# Patient Record
Sex: Female | Born: 1960
Health system: Southern US, Community
[De-identification: ages and names within clinical notes are randomized; demographics above are authoritative.]

## PROBLEM LIST (undated history)

## (undated) DIAGNOSIS — E079 Disorder of thyroid, unspecified: Secondary | ICD-10-CM

## (undated) HISTORY — DX: Disorder of thyroid, unspecified: E07.9

## (undated) HISTORY — PX: OTHER SURGICAL HISTORY: SHX169

## (undated) HISTORY — PX: BREAST CYST EXCISION: SHX579

---

## 2003-09-26 ENCOUNTER — Encounter: Admission: RE | Admit: 2003-09-26 | Discharge: 2003-09-26 | Payer: Self-pay | Admitting: General Surgery

## 2004-04-22 ENCOUNTER — Encounter: Admission: RE | Admit: 2004-04-22 | Discharge: 2004-04-22 | Payer: Self-pay | Admitting: General Surgery

## 2004-11-25 ENCOUNTER — Encounter: Admission: RE | Admit: 2004-11-25 | Discharge: 2004-11-25 | Payer: Self-pay | Admitting: Obstetrics and Gynecology

## 2006-02-04 ENCOUNTER — Encounter: Admission: RE | Admit: 2006-02-04 | Discharge: 2006-02-04 | Payer: Self-pay | Admitting: Obstetrics and Gynecology

## 2007-06-01 ENCOUNTER — Encounter: Admission: RE | Admit: 2007-06-01 | Discharge: 2007-06-01 | Payer: Self-pay | Admitting: Family Medicine

## 2007-06-08 ENCOUNTER — Encounter: Admission: RE | Admit: 2007-06-08 | Discharge: 2007-06-08 | Payer: Self-pay | Admitting: Family Medicine

## 2009-01-10 ENCOUNTER — Encounter: Admission: RE | Admit: 2009-01-10 | Discharge: 2009-01-10 | Payer: Self-pay | Admitting: Family Medicine

## 2009-01-15 ENCOUNTER — Encounter: Admission: RE | Admit: 2009-01-15 | Discharge: 2009-01-15 | Payer: Self-pay | Admitting: Family Medicine

## 2009-01-15 ENCOUNTER — Encounter (INDEPENDENT_AMBULATORY_CARE_PROVIDER_SITE_OTHER): Payer: Self-pay | Admitting: Diagnostic Radiology

## 2009-02-13 ENCOUNTER — Encounter (INDEPENDENT_AMBULATORY_CARE_PROVIDER_SITE_OTHER): Payer: Self-pay | Admitting: General Surgery

## 2009-02-13 ENCOUNTER — Encounter: Admission: RE | Admit: 2009-02-13 | Discharge: 2009-02-13 | Payer: Self-pay | Admitting: General Surgery

## 2009-02-13 ENCOUNTER — Ambulatory Visit (HOSPITAL_BASED_OUTPATIENT_CLINIC_OR_DEPARTMENT_OTHER): Admission: RE | Admit: 2009-02-13 | Discharge: 2009-02-13 | Payer: Self-pay | Admitting: General Surgery

## 2010-02-25 ENCOUNTER — Encounter: Admission: RE | Admit: 2010-02-25 | Discharge: 2010-02-25 | Payer: Self-pay | Admitting: Family Medicine

## 2010-09-29 ENCOUNTER — Encounter: Payer: Self-pay | Admitting: Family Medicine

## 2011-01-21 NOTE — Op Note (Signed)
Jeanette Howard, Jeanette Howard                ACCOUNT NO.:  [id number]   MEDICAL RECORD NO.:  [id number]          PATIENT TYPE:  AMB   LOCATION:  DSC                          FACILITY:  [facility]   PHYSICIAN:  Ollen Gross. Vernell Morgans, M.D. DATE OF BIRTH:  [birth year]   DATE OF PROCEDURE:  02/13/2009  DATE OF DISCHARGE:                               OPERATIVE REPORT   PREOPERATIVE DIAGNOSIS:  Right breast radial scar.   POSTOPERATIVE DIAGNOSIS:  Right breast radial scar.   PROCEDURE:  Right breast needle-localized lumpectomy.   SURGEON:  Ollen Gross. Vernell Morgans, MD   ANESTHESIA:  General via LMA.   PROCEDURE:  After informed consent was obtained, the patient was brought  to the operating room, placed in a supine position on the operating  table.  After adequate induction of general anesthesia, the patient's  right breast was prepped with Betadine and draped in usual sterile  manner.  Earlier in the day, the patient had undergone a wire  localization procedure, and the wire was entering the upper outer aspect  of the right breast and aimed medially.  A curvilinear incision was made  over the path of the wire.  This incision was done with a 15-blade  knife.  This incision was carried down through the skin and subcutaneous  tissue sharply with the 15-blade knife.  Once into the breast tissue,  the path of the wire could be palpated.  A circular portion of breast  tissue was excised sharply around the path of the wire.  This was done  sharply with the electrocautery and dissection was carried down to the  chest wall.  Once this specimen was removed and underwent a specimen  radiograph which showed the clip to be in the center of the specimen, it  all looked very good.  It was then sent to Pathology for further  evaluation, and hemostasis was achieved using the Bovie electrocautery.  The wound was irrigated with copious amounts of saline.  The wound was  infiltrated with 0.25% Marcaine.  The deeper layer of the  wound was  closed with interrupted 3-0 Vicryl stitches, and skin was closed a  running 4-0 Monocryl subcuticular stitch.  Dermabond dressing was  applied.  The patient tolerated the procedure well.  At the end of the  case, all needle, sponge, instrument counts were correct.  The patient  was then awakened, taken to recovery room in stable condition.      Ollen Gross. Vernell Morgans, M.D.  Electronically Signed     PST/MEDQ  D:  02/14/2009  T:  02/15/2009  Job:  [id number]

## 2011-02-28 ENCOUNTER — Other Ambulatory Visit: Payer: Self-pay | Admitting: Family Medicine

## 2011-02-28 DIAGNOSIS — Z1231 Encounter for screening mammogram for malignant neoplasm of breast: Secondary | ICD-10-CM

## 2011-04-04 ENCOUNTER — Ambulatory Visit
Admission: RE | Admit: 2011-04-04 | Discharge: 2011-04-04 | Disposition: A | Discharge status: Home / Self Care | Payer: Managed Care, Other (non HMO) | Source: Ambulatory Visit | Attending: Family Medicine | Admitting: Family Medicine

## 2011-04-04 DIAGNOSIS — Z1231 Encounter for screening mammogram for malignant neoplasm of breast: Secondary | ICD-10-CM

## 2012-04-14 ENCOUNTER — Other Ambulatory Visit: Payer: Self-pay | Admitting: Family Medicine

## 2012-04-14 DIAGNOSIS — Z780 Asymptomatic menopausal state: Secondary | ICD-10-CM

## 2012-04-14 DIAGNOSIS — Z1231 Encounter for screening mammogram for malignant neoplasm of breast: Secondary | ICD-10-CM

## 2012-05-05 ENCOUNTER — Other Ambulatory Visit: Payer: Managed Care, Other (non HMO)

## 2012-05-05 ENCOUNTER — Ambulatory Visit: Payer: Managed Care, Other (non HMO)

## 2012-06-02 ENCOUNTER — Ambulatory Visit
Admission: RE | Admit: 2012-06-02 | Discharge: 2012-06-02 | Disposition: A | Discharge status: Home / Self Care | Payer: Managed Care, Other (non HMO) | Source: Ambulatory Visit | Attending: Family Medicine | Admitting: Family Medicine

## 2012-06-02 DIAGNOSIS — Z1231 Encounter for screening mammogram for malignant neoplasm of breast: Secondary | ICD-10-CM

## 2012-06-02 DIAGNOSIS — Z780 Asymptomatic menopausal state: Secondary | ICD-10-CM

## 2013-04-27 ENCOUNTER — Other Ambulatory Visit: Payer: Self-pay

## 2013-04-27 DIAGNOSIS — Z1231 Encounter for screening mammogram for malignant neoplasm of breast: Secondary | ICD-10-CM

## 2013-06-08 ENCOUNTER — Ambulatory Visit
Admission: RE | Admit: 2013-06-08 | Discharge: 2013-06-08 | Disposition: A | Discharge status: Home / Self Care | Payer: Managed Care, Other (non HMO) | Source: Ambulatory Visit

## 2013-06-08 DIAGNOSIS — Z1231 Encounter for screening mammogram for malignant neoplasm of breast: Secondary | ICD-10-CM

## 2014-05-19 ENCOUNTER — Other Ambulatory Visit: Payer: Self-pay

## 2014-05-19 DIAGNOSIS — Z1231 Encounter for screening mammogram for malignant neoplasm of breast: Secondary | ICD-10-CM

## 2014-06-26 ENCOUNTER — Encounter (INDEPENDENT_AMBULATORY_CARE_PROVIDER_SITE_OTHER): Payer: Self-pay

## 2014-06-26 ENCOUNTER — Ambulatory Visit
Admission: RE | Admit: 2014-06-26 | Discharge: 2014-06-26 | Disposition: A | Discharge status: Home / Self Care | Payer: Managed Care, Other (non HMO) | Source: Ambulatory Visit

## 2014-06-26 DIAGNOSIS — Z1231 Encounter for screening mammogram for malignant neoplasm of breast: Secondary | ICD-10-CM

## 2015-07-09 ENCOUNTER — Other Ambulatory Visit: Payer: Self-pay

## 2015-07-09 DIAGNOSIS — Z1231 Encounter for screening mammogram for malignant neoplasm of breast: Secondary | ICD-10-CM

## 2015-07-16 ENCOUNTER — Other Ambulatory Visit: Payer: Self-pay

## 2015-07-16 ENCOUNTER — Ambulatory Visit
Admission: RE | Admit: 2015-07-16 | Discharge: 2015-07-16 | Disposition: A | Discharge status: Home / Self Care | Payer: Managed Care, Other (non HMO) | Source: Ambulatory Visit

## 2015-07-16 DIAGNOSIS — Z1231 Encounter for screening mammogram for malignant neoplasm of breast: Secondary | ICD-10-CM

## 2016-06-20 ENCOUNTER — Other Ambulatory Visit: Payer: Self-pay | Admitting: Family Medicine

## 2016-06-20 DIAGNOSIS — Z1231 Encounter for screening mammogram for malignant neoplasm of breast: Secondary | ICD-10-CM

## 2016-07-16 ENCOUNTER — Ambulatory Visit: Payer: Managed Care, Other (non HMO)

## 2016-07-25 ENCOUNTER — Ambulatory Visit
Admission: RE | Admit: 2016-07-25 | Discharge: 2016-07-25 | Disposition: A | Discharge status: Home / Self Care | Payer: Managed Care, Other (non HMO) | Source: Ambulatory Visit | Attending: Family Medicine | Admitting: Family Medicine

## 2016-07-25 DIAGNOSIS — Z1231 Encounter for screening mammogram for malignant neoplasm of breast: Secondary | ICD-10-CM

## 2017-06-15 ENCOUNTER — Other Ambulatory Visit: Payer: Self-pay | Admitting: Family Medicine

## 2017-06-15 DIAGNOSIS — Z1231 Encounter for screening mammogram for malignant neoplasm of breast: Secondary | ICD-10-CM

## 2017-07-27 ENCOUNTER — Ambulatory Visit
Admission: RE | Admit: 2017-07-27 | Discharge: 2017-07-27 | Disposition: A | Discharge status: Home / Self Care | Payer: Managed Care, Other (non HMO) | Source: Ambulatory Visit | Attending: Family Medicine | Admitting: Family Medicine

## 2017-07-27 DIAGNOSIS — Z1231 Encounter for screening mammogram for malignant neoplasm of breast: Secondary | ICD-10-CM

## 2017-07-29 ENCOUNTER — Other Ambulatory Visit: Payer: Self-pay | Admitting: Family Medicine

## 2017-07-29 DIAGNOSIS — R928 Other abnormal and inconclusive findings on diagnostic imaging of breast: Secondary | ICD-10-CM

## 2017-08-06 ENCOUNTER — Other Ambulatory Visit: Payer: Self-pay | Admitting: Family Medicine

## 2017-08-06 ENCOUNTER — Ambulatory Visit
Admission: RE | Admit: 2017-08-06 | Discharge: 2017-08-06 | Disposition: A | Discharge status: Home / Self Care | Payer: Managed Care, Other (non HMO) | Source: Ambulatory Visit | Attending: Family Medicine | Admitting: Family Medicine

## 2017-08-06 DIAGNOSIS — N6310 Unspecified lump in the right breast, unspecified quadrant: Secondary | ICD-10-CM

## 2017-08-06 DIAGNOSIS — R928 Other abnormal and inconclusive findings on diagnostic imaging of breast: Secondary | ICD-10-CM

## 2017-11-12 ENCOUNTER — Encounter: Payer: Self-pay | Admitting: Gynecology

## 2017-11-12 ENCOUNTER — Ambulatory Visit (INDEPENDENT_AMBULATORY_CARE_PROVIDER_SITE_OTHER): Payer: Managed Care, Other (non HMO) | Admitting: Gynecology

## 2017-11-12 VITALS — BP 118/76 | Ht 66.5 in | Wt 134.0 lb

## 2017-11-12 DIAGNOSIS — N952 Postmenopausal atrophic vaginitis: Secondary | ICD-10-CM

## 2017-11-12 DIAGNOSIS — Z124 Encounter for screening for malignant neoplasm of cervix: Secondary | ICD-10-CM

## 2017-11-12 DIAGNOSIS — Z1151 Encounter for screening for human papillomavirus (HPV): Secondary | ICD-10-CM | POA: Diagnosis not present

## 2017-11-12 DIAGNOSIS — N941 Unspecified dyspareunia: Secondary | ICD-10-CM | POA: Diagnosis not present

## 2017-11-12 DIAGNOSIS — N882 Stricture and stenosis of cervix uteri: Secondary | ICD-10-CM

## 2017-11-12 NOTE — Progress Notes (Signed)
    Lemar Livingsebecca G. Ronne BinningWray 1961/05/14 [id number]        57 y.o.  G2P0020 new patient who presents with a history of pelvic exam through her primary physician's office where 2 Pap smears in a row returned inadequate due to lack of cellularity and inflammation.  No history of significant abnormal Pap smears previously.  Also complaining of dyspareunia and vaginal dryness.  Has tried OTC lubricants with inadequate results.  No significant hot flushes or sweats.  Being followed for hypothyroidism on thyroid replacement.  Recent mammogram with follow-up ultrasound showed small benign-appearing cysts.  Past medical history,surgical history, problem list, medications, allergies, family history and social history were all reviewed and documented in the EPIC chart.  Directed ROS with pertinent positives and negatives documented in the history of present illness/assessment and plan.  Exam: Kennon PortelaKim Gardner assistant Vitals:   11/12/17 1217  BP: 118/76  Weight: 134 lb (60.8 kg)  Height: 5' 6.5" (1.689 m)   General appearance:  Normal HEENT normal Both breasts exam lying and sitting without masses, retractions, discharge, adenopathy Pelvic external BUS vagina with atrophic changes.  Cervix with atrophic changes.  Stenotic unable to introduce Endo brush.  Pap smear/HPV obtained.  Uterus normal size midline mobile nontender.  Adnexa without masses or tenderness. Rectovaginal exam is normal  Assessment/Plan:  57 y.o. G2P0020 with:  1. Inadequate Pap smears x2.  Vaginal and cervical exam are normal other than atrophic changes without evidence of infection or inflammation.  I did a Pap smear with HPV today although in cervical stenosis and unable to enter the cervical canal.  I reviewed with the patient I anticipate that this will come back with lack of endocervical cells but I think in her given situation that is appropriate and assuming it is negative otherwise then we will except this Pap smear. 2. Vaginal  dryness/dyspareunia.  Exam is consistent with atrophic changes.  Has tried OTC products but not happy with these.  Not having more global symptoms of lack of estrogen such as hot flashes or sweats.  I reviewed options to include vaginal estrogen such as cream, tablets, rings.  Also reviewed Osphena as well as vaginal laser.  The pros and cons of each choice as well as risks versus benefits reviewed.  We discussed the issues of vaginal estrogen with potential for absorption and systemic effects including breast, endometrium and coagulation with increased risk of thrombosis.  After lengthy discussion the patient wants a trial of vaginal estrogen and we will start with formulated estradiol vaginal cream through [facility] twice weekly.  931-month supply.  She will call me near the end of the 3 months to let me know how she is doing and if she is doing well we will refill through the year.  If she is not doing well we will rediscuss options.  Greater than 50% of my 30+ minute office visit was spent in direct face to face counseling and coordination of care with the patient.     Dara Lordsimothy P Ritamarie Arkin MD, 1:05 PM 11/12/2017

## 2017-11-12 NOTE — Addendum Note (Signed)
Addended by: Dayna BarkerGARDNER, Myrle Dues K on: 11/12/2017 01:14 PM   Modules accepted: Orders

## 2017-11-12 NOTE — Patient Instructions (Signed)
Start on the vaginal estrogen cream twice weekly as prescribed from [facility].  Call us in 2-3 months to let us know how you are doing.

## 2017-11-17 LAB — PAP IG AND HPV HIGH-RISK: HPV DNA HIGH RISK: NOT DETECTED

## 2017-11-18 ENCOUNTER — Telehealth: Payer: Self-pay | Admitting: *Deleted

## 2017-11-18 MED ORDER — NONFORMULARY OR COMPOUNDED ITEM: 0 refills | Status: DC

## 2017-11-18 NOTE — Telephone Encounter (Signed)
Patient called stating Rx was never called into [facility] for cream. Per note on 11/12/17 "After lengthy discussion the patient wants a trial of vaginal estrogen and we will start with formulated estradiol vaginal cream through [facility] twice weekly 3 month supply.  She will call me near the end of the 3 months to let me know how she is doing and if she is doing well we will refill through the year.   Message was never sent to me to call in, Rx called in, pt aware.

## 2018-02-04 ENCOUNTER — Other Ambulatory Visit: Payer: Managed Care, Other (non HMO)

## 2018-02-08 ENCOUNTER — Other Ambulatory Visit: Payer: Self-pay | Admitting: Family Medicine

## 2018-02-08 ENCOUNTER — Ambulatory Visit
Admission: RE | Admit: 2018-02-08 | Discharge: 2018-02-08 | Disposition: A | Discharge status: Home / Self Care | Payer: Managed Care, Other (non HMO) | Source: Ambulatory Visit | Attending: Family Medicine | Admitting: Family Medicine

## 2018-02-08 DIAGNOSIS — N6310 Unspecified lump in the right breast, unspecified quadrant: Secondary | ICD-10-CM

## 2018-02-09 ENCOUNTER — Other Ambulatory Visit: Payer: Self-pay | Admitting: Family Medicine

## 2018-02-09 DIAGNOSIS — N6310 Unspecified lump in the right breast, unspecified quadrant: Secondary | ICD-10-CM

## 2018-02-10 ENCOUNTER — Ambulatory Visit
Admission: RE | Admit: 2018-02-10 | Discharge: 2018-02-10 | Disposition: A | Discharge status: Home / Self Care | Payer: Managed Care, Other (non HMO) | Source: Ambulatory Visit | Attending: Family Medicine | Admitting: Family Medicine

## 2018-02-10 DIAGNOSIS — N6310 Unspecified lump in the right breast, unspecified quadrant: Secondary | ICD-10-CM

## 2018-02-19 ENCOUNTER — Telehealth: Payer: Self-pay | Admitting: *Deleted

## 2018-02-19 NOTE — Telephone Encounter (Signed)
Patient was prescribed compound estradiol vaginal cream 0.02% twice weekly told to call back and let you now how she is doing, pt said medication is working well and she would like to continue Rx. Please advise

## 2018-02-22 MED ORDER — NONFORMULARY OR COMPOUNDED ITEM: 2 refills | Status: DC

## 2018-02-22 NOTE — Telephone Encounter (Signed)
Rx called in 

## 2018-02-22 NOTE — Telephone Encounter (Signed)
Refill through March 2020

## 2018-06-18 ENCOUNTER — Other Ambulatory Visit: Payer: Self-pay | Admitting: Family Medicine

## 2018-06-18 DIAGNOSIS — Z1231 Encounter for screening mammogram for malignant neoplasm of breast: Secondary | ICD-10-CM

## 2018-07-28 ENCOUNTER — Ambulatory Visit
Admission: RE | Admit: 2018-07-28 | Discharge: 2018-07-28 | Disposition: A | Discharge status: Home / Self Care | Payer: Managed Care, Other (non HMO) | Source: Ambulatory Visit | Attending: Family Medicine | Admitting: Family Medicine

## 2018-07-28 DIAGNOSIS — Z1231 Encounter for screening mammogram for malignant neoplasm of breast: Secondary | ICD-10-CM

## 2019-06-07 ENCOUNTER — Encounter: Payer: Self-pay | Admitting: Gynecology

## 2019-06-20 ENCOUNTER — Other Ambulatory Visit: Payer: Self-pay | Admitting: Family Medicine

## 2019-06-20 DIAGNOSIS — Z1231 Encounter for screening mammogram for malignant neoplasm of breast: Secondary | ICD-10-CM

## 2019-07-06 ENCOUNTER — Other Ambulatory Visit: Payer: Self-pay

## 2019-07-06 ENCOUNTER — Encounter: Payer: Self-pay | Admitting: Gynecology

## 2019-07-06 ENCOUNTER — Ambulatory Visit (INDEPENDENT_AMBULATORY_CARE_PROVIDER_SITE_OTHER): Payer: BC Managed Care – PPO | Admitting: Gynecology

## 2019-07-06 VITALS — BP 120/74 | Ht 67.0 in | Wt 133.0 lb

## 2019-07-06 DIAGNOSIS — Z01419 Encounter for gynecological examination (general) (routine) without abnormal findings: Secondary | ICD-10-CM

## 2019-07-06 DIAGNOSIS — N952 Postmenopausal atrophic vaginitis: Secondary | ICD-10-CM | POA: Diagnosis not present

## 2019-07-06 NOTE — Progress Notes (Signed)
    Gardiner Rhyme Zweig 22-Oct-1960 [id number]        58 y.o.  G2P0020 for annual gynecologic exam.  Started on vaginal estradiol cream last year for vaginal dryness and dyspareunia notes a 50% improvement but still having some discomfort using it twice weekly.  Past medical history,surgical history, problem list, medications, allergies, family history and social history were all reviewed and documented as reviewed in the EPIC chart.  ROS:  Performed with pertinent positives and negatives included in the history, assessment and plan.   Additional significant findings : None   Exam: Caryn Bee assistant Vitals:   07/06/19 1503  BP: 120/74  Weight: 133 lb (60.3 kg)  Height: 5\' 7"  (1.702 m)   Body mass index is 20.83 kg/m.  General appearance:  Normal affect, orientation and appearance. Skin: Grossly normal HEENT: Without gross lesions.  No cervical or supraclavicular adenopathy. Thyroid normal.  Lungs:  Clear without wheezing, rales or rhonchi Cardiac: RR, without RMG Abdominal:  Soft, nontender, without masses, guarding, rebound, organomegaly or hernia Breasts:  Examined lying and sitting without masses, retractions, discharge or axillary adenopathy. Pelvic:  Ext, BUS, Vagina: With atrophic changes  Cervix: With atrophic changes  Uterus: Anteverted, normal size, shape and contour, midline and mobile nontender   Adnexa: Without masses or tenderness    Anus and perineum: Normal   Rectovaginal: Normal sphincter tone without palpated masses or tenderness.    Assessment/Plan:  58 y.o. G77P0020 female for annual gynecologic exam.   1. Postmenopausal/atrophic genital changes.  Using vaginal estradiol cream twice weekly.  Having some improvement in her symptoms but not all the way.  Recommend that she increase to 3 times weekly and see if this does not help.  We will follow-up if continues to be an issue.  No bleeding or other menopausal symptoms. 2. Mammography scheduled in November and  she will follow-up for this. 3. DEXA 2013 normal.  Plan repeat DEXA at age 32. 46. Colonoscopy 2018.  Repeat at their recommended interval. 5. Pap smear/HPV 11/2017 negative.  No Pap smear done today.  No history of significant abnormal Pap smears.  Plan repeat Pap smear/HPV at 5-year interval per current screening guidelines. 6. Health maintenance.  No routine lab work done as patient does this elsewhere.  Follow-up 1 year, sooner as needed.   Anastasio Auerbach MD, 3:26 PM 07/06/2019

## 2019-07-06 NOTE — Patient Instructions (Signed)
Increase your vaginal estrogen cream to 3 times weekly and see if this does not help with your symptoms.  Congratulations on your new grandson !

## 2019-08-24 ENCOUNTER — Ambulatory Visit: Payer: Managed Care, Other (non HMO)

## 2019-09-06 DIAGNOSIS — Z13220 Encounter for screening for lipoid disorders: Secondary | ICD-10-CM | POA: Diagnosis not present

## 2019-09-06 DIAGNOSIS — Z0000 Encounter for general adult medical examination without abnormal findings: Secondary | ICD-10-CM | POA: Diagnosis not present

## 2019-09-06 DIAGNOSIS — E890 Postprocedural hypothyroidism: Secondary | ICD-10-CM | POA: Diagnosis not present

## 2019-09-13 DIAGNOSIS — Z13220 Encounter for screening for lipoid disorders: Secondary | ICD-10-CM | POA: Diagnosis not present

## 2019-09-13 DIAGNOSIS — E890 Postprocedural hypothyroidism: Secondary | ICD-10-CM | POA: Diagnosis not present

## 2019-09-13 DIAGNOSIS — F325 Major depressive disorder, single episode, in full remission: Secondary | ICD-10-CM | POA: Diagnosis not present

## 2019-09-13 DIAGNOSIS — Z0000 Encounter for general adult medical examination without abnormal findings: Secondary | ICD-10-CM | POA: Diagnosis not present

## 2019-10-11 ENCOUNTER — Ambulatory Visit
Admission: RE | Admit: 2019-10-11 | Discharge: 2019-10-11 | Disposition: A | Discharge status: Home / Self Care | Payer: BC Managed Care – PPO | Source: Ambulatory Visit | Attending: Family Medicine | Admitting: Family Medicine

## 2019-10-11 ENCOUNTER — Other Ambulatory Visit: Payer: Self-pay

## 2019-10-11 DIAGNOSIS — Z1231 Encounter for screening mammogram for malignant neoplasm of breast: Secondary | ICD-10-CM

## 2020-01-30 DIAGNOSIS — N3001 Acute cystitis with hematuria: Secondary | ICD-10-CM | POA: Diagnosis not present

## 2020-05-03 ENCOUNTER — Other Ambulatory Visit: Payer: Self-pay

## 2020-05-03 MED ORDER — NONFORMULARY OR COMPOUNDED ITEM: 0 refills | Status: DC

## 2020-05-03 NOTE — Telephone Encounter (Signed)
Has CE scheduled 07/09/20 with TW.

## 2020-05-23 IMAGING — MG DIGITAL SCREENING BILAT W/ CAD
4 series · 4 of 4 positions shown · non-contrast
Comparison: Previous exam(s).

CLINICAL DATA: Screening.

EXAM:
DIGITAL SCREENING BILATERAL MAMMOGRAM WITH CAD

[R MLO]
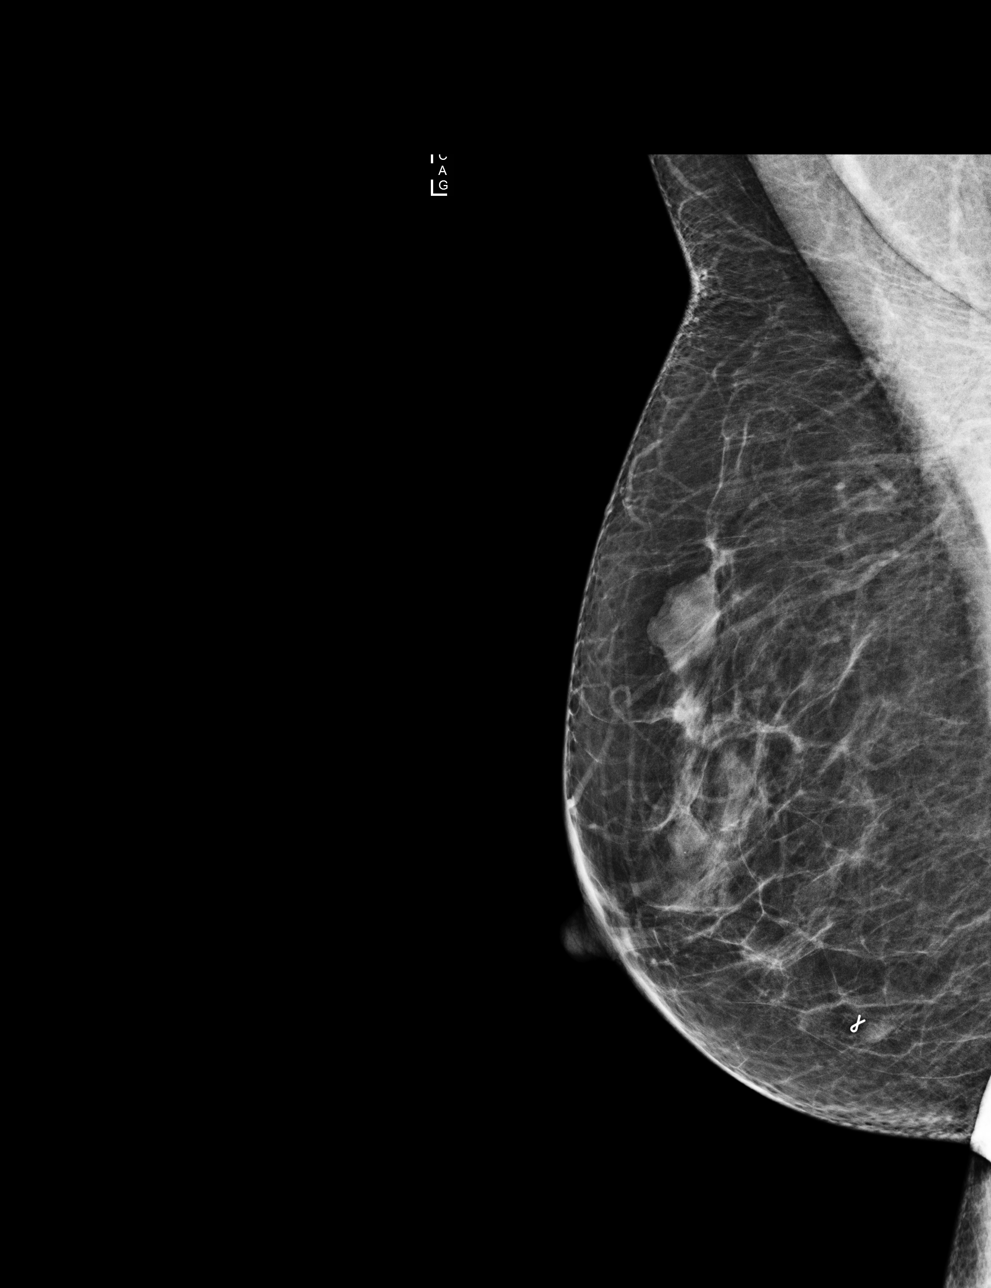

[L MLO]
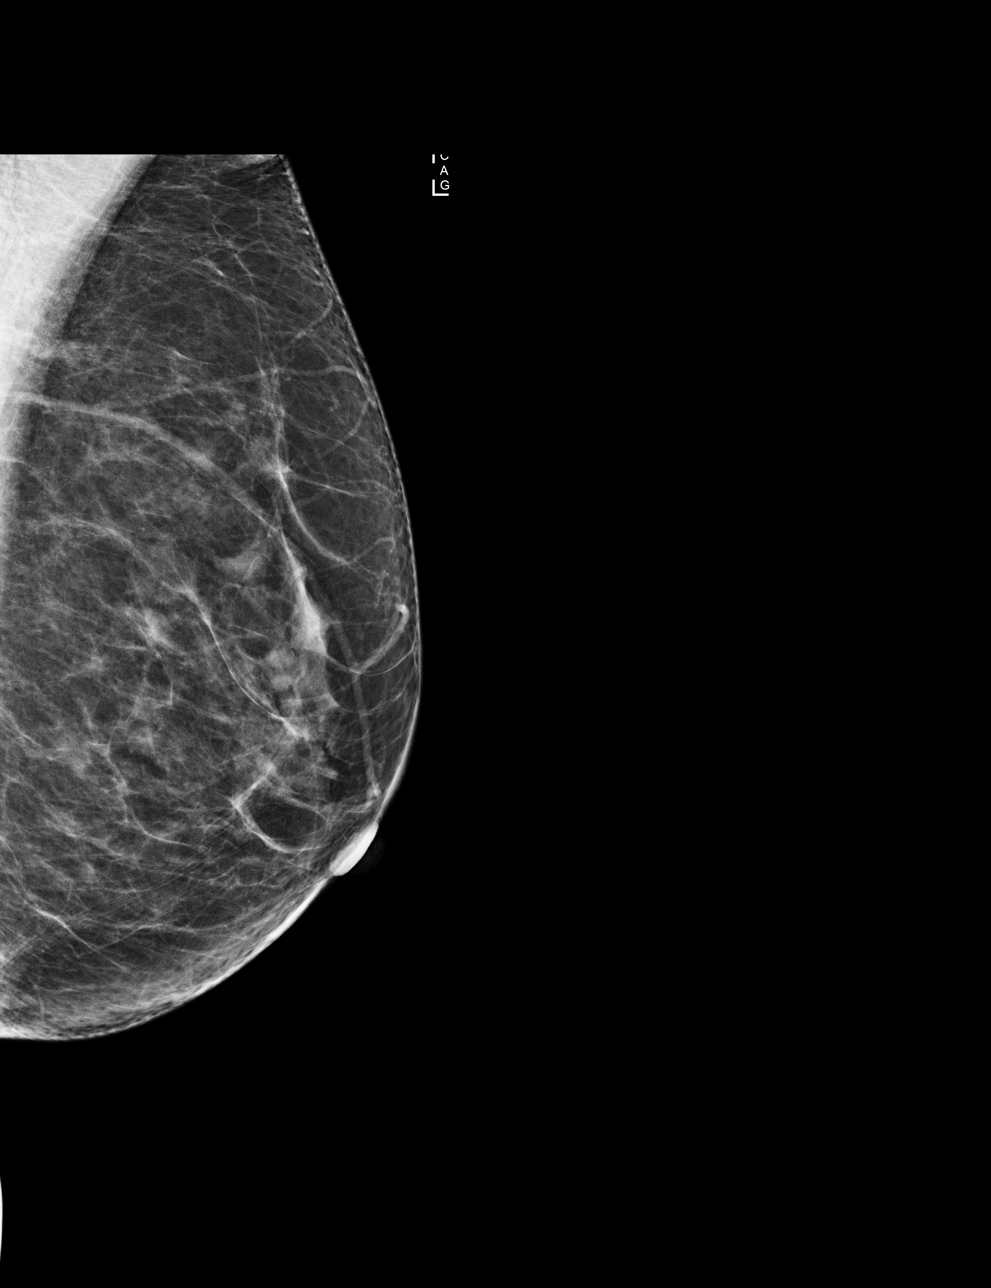

[L CC]
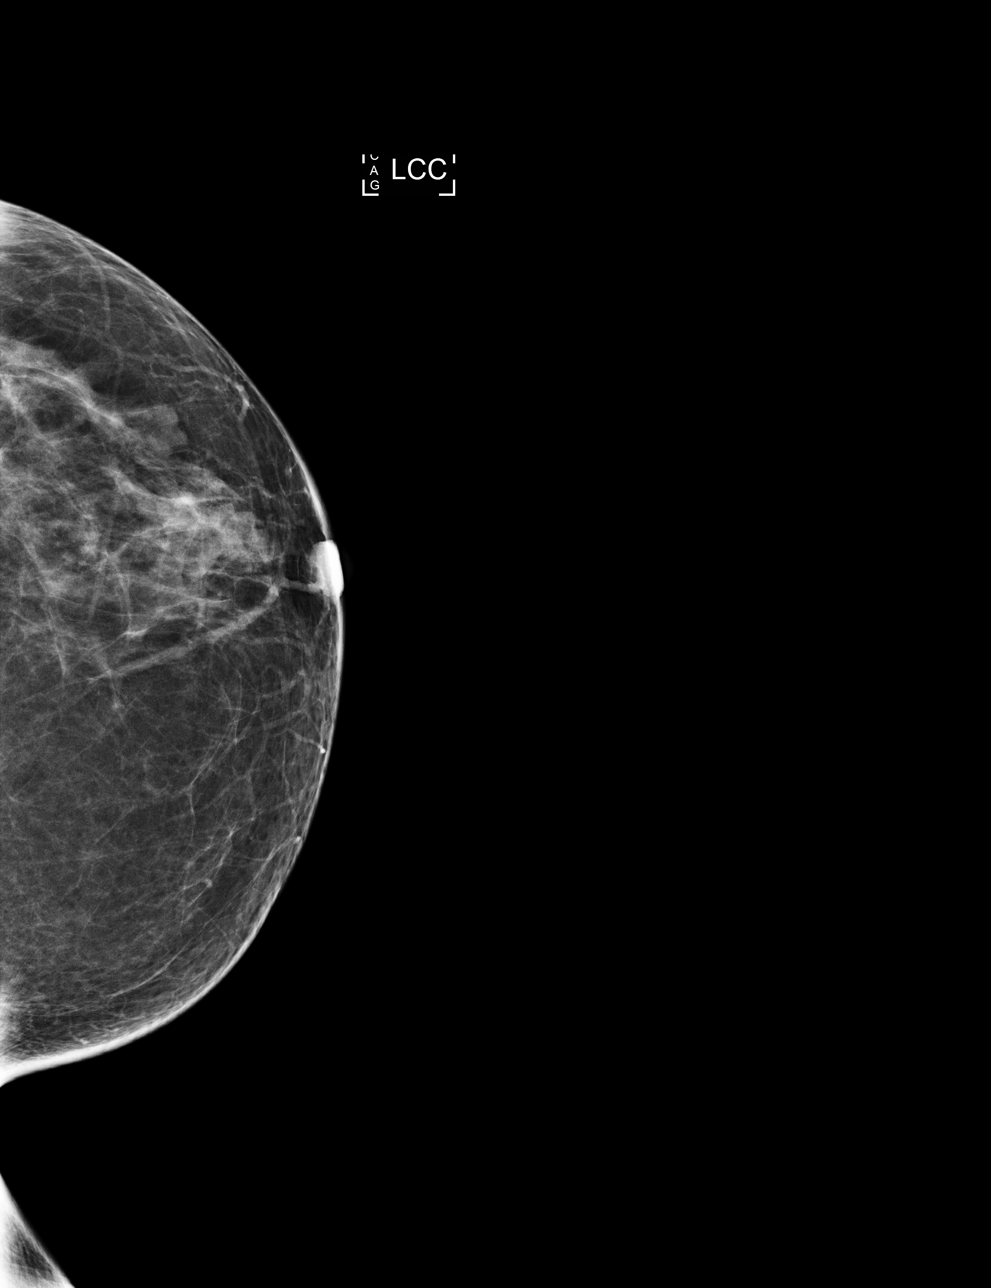

[R CC]
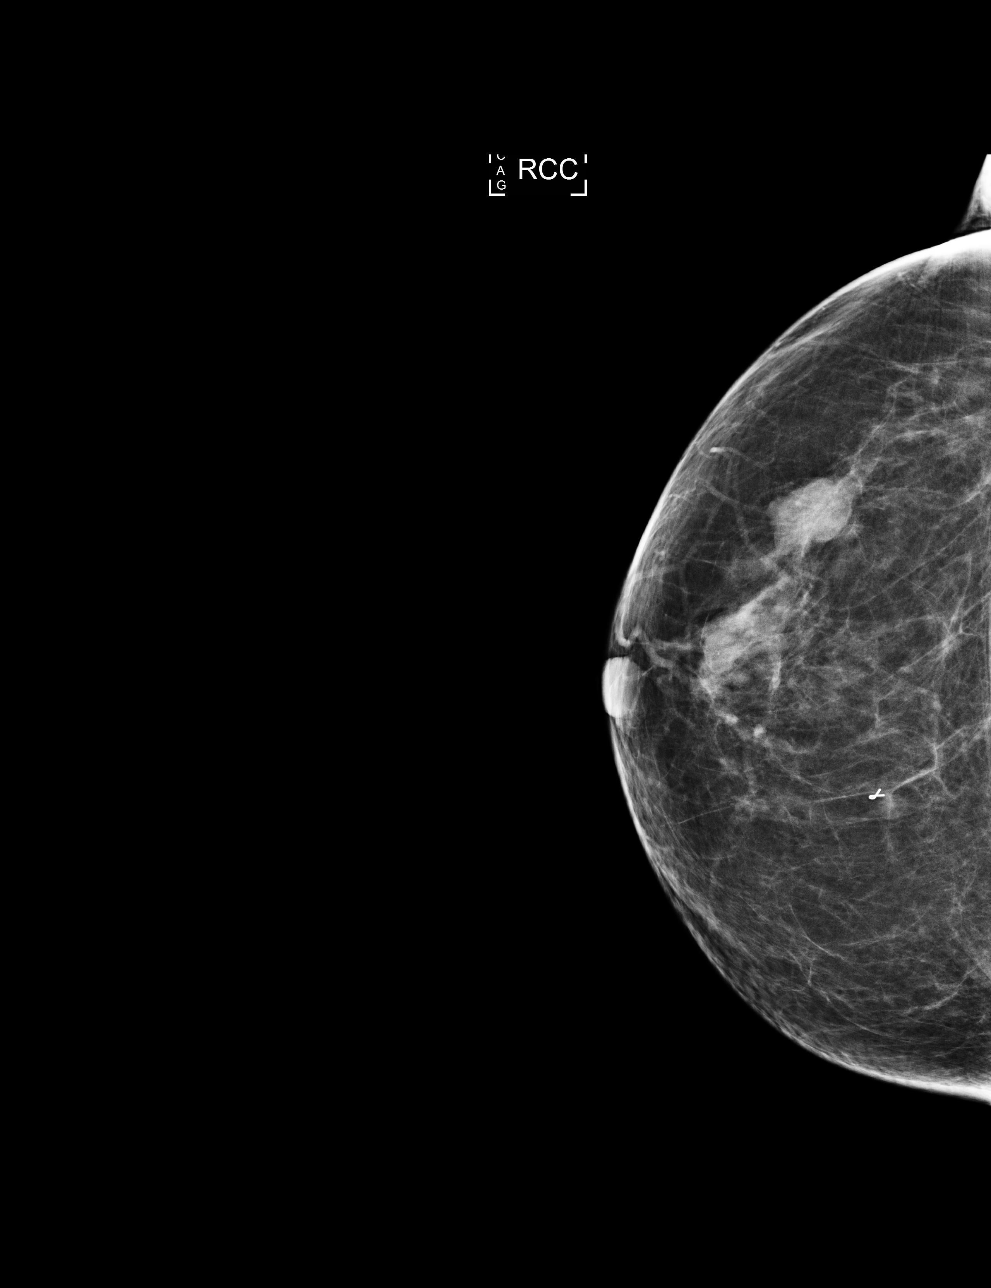

[4 of 4 positions shown; findings below may reference images not displayed]

ACR Breast Density Category c: The breast tissue is heterogeneously
dense, which may obscure small masses.
FINDINGS: There are no findings suspicious for malignancy. Images were
processed with CAD.
IMPRESSION: No mammographic evidence of malignancy. A result letter of this
screening mammogram will be mailed directly to the patient.

RECOMMENDATION:
Screening mammogram in one year. (Code:YJ-[id number])

BI-RADS CATEGORY  1: Negative.

## 2020-07-09 ENCOUNTER — Ambulatory Visit (INDEPENDENT_AMBULATORY_CARE_PROVIDER_SITE_OTHER): Payer: BC Managed Care – PPO | Admitting: Nurse Practitioner

## 2020-07-09 ENCOUNTER — Encounter: Payer: Self-pay | Admitting: Nurse Practitioner

## 2020-07-09 ENCOUNTER — Other Ambulatory Visit: Payer: Self-pay

## 2020-07-09 VITALS — BP 120/78 | Ht 67.0 in | Wt 133.0 lb

## 2020-07-09 DIAGNOSIS — N951 Menopausal and female climacteric states: Secondary | ICD-10-CM

## 2020-07-09 DIAGNOSIS — Z01419 Encounter for gynecological examination (general) (routine) without abnormal findings: Secondary | ICD-10-CM | POA: Diagnosis not present

## 2020-07-09 NOTE — Progress Notes (Signed)
   Akaila WYOVZCHYIF Markham 01-23-1961 [id number]   History:  59 y.o. G2P0020 presents for annual exam without GYN complaints. Postmenopausal - no HRT, no bleeding. Uses estradiol vaginal cream twice weekly for dryness and dyspareunia. Normal pap and mammogram history. Cyst excision of right breast years ago. Hypothyroidism managed by PCP.   Gynecologic History No LMP recorded. Patient is postmenopausal.   Contraception: post menopausal status Last Pap: 11/12/2017. Results were: normal Last mammogram: 10/11/2019. Results were: normal Last colonoscopy: 2018. Results were: normal Last Dexa: 2013. Results were: normal, repeat at age 18  Past medical history, past surgical history, family history and social history were all reviewed and documented in the EPIC chart.  ROS:  A ROS was performed and pertinent positives and negatives are included.  Exam:  Vitals:   07/09/20 0902  BP: 120/78  Weight: 133 lb (60.3 kg)  Height: 5\' 7"  (1.702 m)   Body mass index is 20.83 kg/m.  General appearance:  Normal Thyroid:  Symmetrical, normal in size, without palpable masses or nodularity. Respiratory  Auscultation:  Clear without wheezing or rhonchi Cardiovascular  Auscultation:  Regular rate, without rubs, murmurs or gallops  Edema/varicosities:  Not grossly evident Abdominal  Soft,nontender, without masses, guarding or rebound.  Liver/spleen:  No organomegaly noted  Hernia:  None appreciated  Skin  Inspection:  Grossly normal   Breasts: Examined lying and sitting.   Right: Without masses, retractions, discharge or axillary adenopathy.   Left: Without masses, retractions, discharge or axillary adenopathy. Gentitourinary   Inguinal/mons:  Normal without inguinal adenopathy  External genitalia:  Normal  BUS/Urethra/Skene's glands:  Normal  Vagina:  Normal  Cervix:  Normal  Uterus:  Normal in size, shape and contour.  Midline and mobile  Adnexa/parametria:     Rt: Without masses or  tenderness.   Lt: Without masses or tenderness.  Anus and perineum: Normal  Digital rectal exam: Normal sphincter tone without palpated masses or tenderness  Assessment/Plan:  59 y.o. G2P0020 for annual exam.   Well female exam with routine gynecological exam - Education provided on SBEs, importance of preventative screenings, current guidelines, high calcium diet, regular exercise, and multivitamin daily. Labs with PCP.   Menopausal vaginal dryness - Estradiol 0.02% vaginal cream twice weekly with some improvement. Using OTC lubricant for intercourse but has irritation/itching from them. Recommended coconut oil for lubricant.  Screening for cervical cancer -normal Pap history. Repeat Pap at 5-year interval per guidelines.  Screening for breast cancer -normal mammogram history. Normal breast exam today. Continue annual screenings.  Screening for colon cancer -we will repeat at GI's recommended interval.  Follow-up in 1 year for annual.     [facility], 9:15 AM 07/09/2020

## 2020-07-09 NOTE — Patient Instructions (Signed)

## 2020-07-13 DIAGNOSIS — Z23 Encounter for immunization: Secondary | ICD-10-CM | POA: Diagnosis not present

## 2020-08-27 ENCOUNTER — Other Ambulatory Visit: Payer: Self-pay | Admitting: *Deleted

## 2020-08-27 MED ORDER — NONFORMULARY OR COMPOUNDED ITEM: 4 refills | Status: DC

## 2020-08-27 NOTE — Telephone Encounter (Signed)
Rx called in 

## 2020-09-05 DIAGNOSIS — J069 Acute upper respiratory infection, unspecified: Secondary | ICD-10-CM | POA: Diagnosis not present

## 2020-09-05 DIAGNOSIS — Z1152 Encounter for screening for COVID-19: Secondary | ICD-10-CM | POA: Diagnosis not present

## 2020-10-29 ENCOUNTER — Other Ambulatory Visit: Payer: Self-pay | Admitting: Family Medicine

## 2020-10-29 DIAGNOSIS — Z1231 Encounter for screening mammogram for malignant neoplasm of breast: Secondary | ICD-10-CM

## 2020-12-19 ENCOUNTER — Other Ambulatory Visit: Payer: Self-pay

## 2020-12-19 ENCOUNTER — Ambulatory Visit
Admission: RE | Admit: 2020-12-19 | Discharge: 2020-12-19 | Disposition: A | Discharge status: Home / Self Care | Payer: BC Managed Care – PPO | Source: Ambulatory Visit | Attending: Family Medicine | Admitting: Family Medicine

## 2020-12-19 DIAGNOSIS — Z1231 Encounter for screening mammogram for malignant neoplasm of breast: Secondary | ICD-10-CM

## 2021-03-27 ENCOUNTER — Other Ambulatory Visit: Payer: Self-pay

## 2021-03-27 MED ORDER — NONFORMULARY OR COMPOUNDED ITEM: 1 refills | Status: DC

## 2021-03-27 NOTE — Telephone Encounter (Signed)
AEX 07/09/20 

## 2021-03-27 NOTE — Telephone Encounter (Signed)
Phoned in to pharmacy. 

## 2021-08-15 ENCOUNTER — Other Ambulatory Visit: Payer: Self-pay

## 2021-08-15 ENCOUNTER — Encounter: Payer: Self-pay | Admitting: Nurse Practitioner

## 2021-08-15 ENCOUNTER — Ambulatory Visit (INDEPENDENT_AMBULATORY_CARE_PROVIDER_SITE_OTHER): Payer: 59 | Admitting: Nurse Practitioner

## 2021-08-15 VITALS — BP 114/78 | HR 56 | Ht 66.25 in | Wt 132.0 lb

## 2021-08-15 DIAGNOSIS — Z01419 Encounter for gynecological examination (general) (routine) without abnormal findings: Secondary | ICD-10-CM

## 2021-08-15 DIAGNOSIS — N951 Menopausal and female climacteric states: Secondary | ICD-10-CM

## 2021-08-15 DIAGNOSIS — Z780 Asymptomatic menopausal state: Secondary | ICD-10-CM | POA: Diagnosis not present

## 2021-08-15 MED ORDER — NONFORMULARY OR COMPOUNDED ITEM
1 refills | Status: DC
Start: 2021-08-15 — End: 2022-06-06

## 2021-08-15 NOTE — Progress Notes (Signed)
[employer]y TRVUYEBXID Zwick 1961-03-19 [id number]   History:  60 y.o. G2P0020 presents for annual exam without GYN complaints. Postmenopausal - no HRT, no bleeding. Uses estradiol vaginal cream and coconut oil for dryness and dyspareunia. She has not been using the estradiol cream as often and prefers coconut oil. Normal pap and mammogram history. Hypothyroidism managed by PCP. Fell on Thanksgiving and injured her hip, likely sciatic nerve, doing better.   Gynecologic History No LMP recorded. Patient is postmenopausal.   Contraception: post menopausal status Sexually active: Yes  Health maintenance Last Pap: 11/12/2017. Results were: Normal, 5-year repeat Last mammogram: 12/19/2020. Results were: Normal Last colonoscopy: 02/28/2021. Results were: Benign polyps, 5-year recall Last Dexa: 2013. Results were: Normal, repeat at age 5  Past medical history, past surgical history, family history and social history were all reviewed and documented in the EPIC chart. Married. 3 adopted children, 5 grandchildren. Works for [employer].   ROS:  A ROS was performed and pertinent positives and negatives are included.  Exam:  Vitals:   08/15/21 0815  BP: 114/78  Pulse: (!) 56  SpO2: 98%  Weight: 132 lb (59.9 kg)  Height: 5' 6.25" (1.683 m)    Body mass index is 21.14 kg/m.  General appearance:  Normal Thyroid:  Symmetrical, normal in size, without palpable masses or nodularity. Respiratory  Auscultation:  Clear without wheezing or rhonchi Cardiovascular  Auscultation:  Regular rate, without rubs, murmurs or gallops  Edema/varicosities:  Not grossly evident Abdominal  Soft,nontender, without masses, guarding or rebound.  Liver/spleen:  No organomegaly noted  Hernia:  None appreciated  Skin  Inspection:  Grossly normal   Breasts: Examined lying and sitting.   Right: Without masses, retractions, discharge or axillary adenopathy.   Left: Without masses, retractions, discharge  or axillary adenopathy. Genitourinary   Inguinal/mons:  Normal without inguinal adenopathy  External genitalia:  Normal appearing vulva with no masses, tenderness, or lesions  BUS/Urethra/Skene's glands:  Normal  Vagina:  Normal appearing with normal color and discharge, no lesions. Atrophic changes  Cervix:  Normal appearing without discharge or lesions  Uterus:  Normal in size, shape and contour.  Midline and mobile, nontender  Adnexa/parametria:     Rt: Normal in size, without masses or tenderness.   Lt: Normal in size, without masses or tenderness.  Anus and perineum: Normal  Digital rectal exam: Normal sphincter tone without palpated masses or tenderness  Patient informed chaperone available to be present for breast and pelvic exam. Patient has requested no chaperone to be present. Patient has been advised what will be completed during breast and pelvic exam.   Assessment/Plan:  60 y.o. G2P0020 for annual exam.   Well female exam with routine gynecological exam - Education provided on SBEs, importance of preventative screenings, current guidelines, high calcium diet, regular exercise, and multivitamin daily. Labs with PCP.   Menopausal vaginal dryness - Estradiol 0.02% vaginal cream and coconut oil with good management. She is using estradiol cream less often and prefers coconut oil.   Screening for cervical cancer - Normal Pap history. Repeat Pap at 5-year interval per guidelines.  Screening for breast cancer - 2010 benign right breast excision, otherwise normal mammogram history. Normal breast exam today. Continue annual screenings.  Screening for colon cancer - 02/2021 colonoscopy. Will repeat at 5-year interval per GI's recommendation.   Screening for osteoporosis - Norma bone density in 2013. Will schedule DXA next year at age 10.   Follow-up in 1 year for annual.  [name] [facility], 8:31 AM 08/15/2021

## 2022-06-06 ENCOUNTER — Other Ambulatory Visit: Payer: Self-pay | Admitting: *Deleted

## 2022-06-06 DIAGNOSIS — N951 Menopausal and female climacteric states: Secondary | ICD-10-CM

## 2022-06-06 NOTE — Telephone Encounter (Signed)
[facility] called requesting refill on compound estradiol cream 0.02%.   Last annual exam was 08/2021

## 2022-06-09 MED ORDER — NONFORMULARY OR COMPOUNDED ITEM: 0 refills | Status: AC

## 2022-06-10 NOTE — Telephone Encounter (Signed)
Rx called in 

## 2022-08-14 ENCOUNTER — Encounter: Payer: Self-pay | Admitting: Nurse Practitioner

## 2022-08-20 ENCOUNTER — Ambulatory Visit: Payer: 59 | Admitting: Nurse Practitioner

## 2022-08-20 NOTE — Progress Notes (Deleted)
   Jeanette Howard Christoph 1961/03/30 [id number]   History:  61 y.o. G2P0020 presents for annual exam without GYN complaints. Postmenopausal - no HRT, no bleeding. Uses estradiol vaginal cream and coconut oil for dryness and dyspareunia. She has not been using the estradiol cream as often and prefers coconut oil. Normal pap and mammogram history. Hypothyroidism, MDD managed by PCP.   Gynecologic History No LMP recorded. Patient is postmenopausal.   Contraception: post menopausal status Sexually active: Yes  Health maintenance Last Pap: 11/12/2017. Results were: Normal neg HPV, 5-year repeat Last mammogram: 12/19/2020. Results were: Normal Last colonoscopy: 02/28/2021. Results were: Benign polyps, 5-year recall Last Dexa: 2013. Results were: Normal, repeat at age 29  Past medical history, past surgical history, family history and social history were all reviewed and documented in the EPIC chart. Married. 3 adopted children, 5 grandchildren. Works for [employer].   ROS:  A ROS was performed and pertinent positives and negatives are included.  Exam:  There were no vitals filed for this visit.   There is no height or weight on file to calculate BMI.  General appearance:  Normal Thyroid:  Symmetrical, normal in size, without palpable masses or nodularity. Respiratory  Auscultation:  Clear without wheezing or rhonchi Cardiovascular  Auscultation:  Regular rate, without rubs, murmurs or gallops  Edema/varicosities:  Not grossly evident Abdominal  Soft,nontender, without masses, guarding or rebound.  Liver/spleen:  No organomegaly noted  Hernia:  None appreciated  Skin  Inspection:  Grossly normal   Breasts: Examined lying and sitting.   Right: Without masses, retractions, discharge or axillary adenopathy.   Left: Without masses, retractions, discharge or axillary adenopathy. Genitourinary   Inguinal/mons:  Normal without inguinal adenopathy  External genitalia:  Normal  appearing vulva with no masses, tenderness, or lesions  BUS/Urethra/Skene's glands:  Normal  Vagina:  Normal appearing with normal color and discharge, no lesions. Atrophic changes  Cervix:  Normal appearing without discharge or lesions  Uterus:  Normal in size, shape and contour.  Midline and mobile, nontender  Adnexa/parametria:     Rt: Normal in size, without masses or tenderness.   Lt: Normal in size, without masses or tenderness.  Anus and perineum: Normal  Digital rectal exam: Normal sphincter tone without palpated masses or tenderness  Patient informed chaperone available to be present for breast and pelvic exam. Patient has requested no chaperone to be present. Patient has been advised what will be completed during breast and pelvic exam.   Assessment/Plan:  61 y.o. G2P0020 for annual exam.   Well female exam with routine gynecological exam - Education provided on SBEs, importance of preventative screenings, current guidelines, high calcium diet, regular exercise, and multivitamin daily. Labs with PCP.   Menopausal vaginal dryness - Estradiol 0.02% vaginal cream and coconut oil with good management. She is using estradiol cream less often and prefers coconut oil.   Screening for cervical cancer - Normal Pap history. Repeat Pap at 5-year interval per guidelines.  Screening for breast cancer - 2010 benign right breast excision, otherwise normal mammogram history. Normal breast exam today. Continue annual screenings.  Screening for colon cancer - 02/2021 colonoscopy. Will repeat at 5-year interval per GI's recommendation.   Screening for osteoporosis - Norma bone density in 2013. Will schedule DXA next year at age 88.   Follow-up in 1 year for annual.     [name] [facility], 7:39 AM 08/20/2022
